# Patient Record
Sex: Male | Born: 1985 | Race: White | Hispanic: No | Marital: Married | Smoking: Never smoker
Health system: Southern US, Community
[De-identification: ages and names within clinical notes are randomized; demographics above are authoritative.]

---

## 2008-08-29 ENCOUNTER — Emergency Department: Payer: Self-pay | Admitting: Emergency Medicine
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
## 2010-09-28 ENCOUNTER — Emergency Department: Payer: Self-pay | Admitting: Emergency Medicine

## 2020-07-12 ENCOUNTER — Ambulatory Visit
Admission: EM | Admit: 2020-07-12 | Discharge: 2020-07-12 | Disposition: A | Discharge status: Home / Self Care | Payer: Self-pay | Attending: Family Medicine | Admitting: Family Medicine

## 2020-07-12 ENCOUNTER — Encounter: Payer: Self-pay | Admitting: Emergency Medicine

## 2020-07-12 ENCOUNTER — Other Ambulatory Visit: Payer: Self-pay

## 2020-07-12 DIAGNOSIS — R0981 Nasal congestion: Secondary | ICD-10-CM

## 2020-07-12 DIAGNOSIS — R059 Cough, unspecified: Secondary | ICD-10-CM

## 2020-07-12 DIAGNOSIS — J0110 Acute frontal sinusitis, unspecified: Secondary | ICD-10-CM

## 2020-07-12 DIAGNOSIS — J3489 Other specified disorders of nose and nasal sinuses: Secondary | ICD-10-CM

## 2020-07-12 DIAGNOSIS — R519 Headache, unspecified: Secondary | ICD-10-CM

## 2020-07-12 DIAGNOSIS — H66002 Acute suppurative otitis media without spontaneous rupture of ear drum, left ear: Secondary | ICD-10-CM

## 2020-07-12 MED ORDER — AMOXICILLIN-POT CLAVULANATE 875-125 MG PO TABS: 1.0000 | ORAL_TABLET | Freq: Two times a day (BID) | ORAL | 0 refills | Status: AC

## 2020-07-12 NOTE — ED Provider Notes (Signed)
[facility]   [id number] 07/12/20 Arrival Time: 1008   CC: COVID symptoms  SUBJECTIVE: History from: patient.  Jose Cobb is a 34 y.o. male who presents with abrupt onset of nasal congestion, PND, fatigue, headache, and sinus pain for the last week. Denies sick exposure to COVID, flu or strep. Denies recent travel. Has negative history of Covid. Has not completed Covid vaccines. Has taken Mucinex with temporary relief. Had negative Covid test at home. Declines Covid testing today. There are no aggravating or alleviating factors. Denies previous symptoms in the past. Denies fever, chills, fatigue, rhinorrhea, sore throat, SOB, wheezing, chest pain, nausea, changes in bowel or bladder habits.    ROS: As per HPI.  All other pertinent ROS negative.     History reviewed. No pertinent past medical history. History reviewed. No pertinent surgical history. No Known Allergies No current facility-administered medications on file prior to encounter.   No current outpatient medications on file prior to encounter.   Social History   Socioeconomic History  . Marital status: Married    Spouse name: Not on file  . Number of children: Not on file  . Years of education: Not on file  . Highest education level: Not on file  Occupational History  . Not on file  Tobacco Use  . Smoking status: Never Smoker  . Smokeless tobacco: Never Used  Substance and Sexual Activity  . Alcohol use: Not Currently  . Drug use: Never  . Sexual activity: Not on file  Other Topics Concern  . Not on file  Social History Narrative  . Not on file   Social Determinants of Health   Financial Resource Strain:   . Difficulty of Paying Living Expenses: Not on file  Food Insecurity:   . Worried About Programme researcher, broadcasting/film/video in the Last Year: Not on file  . Ran Out of Food in the Last Year: Not on file  Transportation Needs:   . Lack of Transportation (Medical): Not on file  . Lack of Transportation  (Non-Medical): Not on file  Physical Activity:   . Days of Exercise per Week: Not on file  . Minutes of Exercise per Session: Not on file  Stress:   . Feeling of Stress : Not on file  Social Connections:   . Frequency of Communication with Friends and Family: Not on file  . Frequency of Social Gatherings with Friends and Family: Not on file  . Attends Religious Services: Not on file  . Active Member of Clubs or Organizations: Not on file  . Attends Banker Meetings: Not on file  . Marital Status: Not on file  Intimate Partner Violence:   . Fear of Current or Ex-Partner: Not on file  . Emotionally Abused: Not on file  . Physically Abused: Not on file  . Sexually Abused: Not on file   History reviewed. No pertinent family history.  OBJECTIVE:  Vitals:   07/12/20 1024  BP: 106/72  Pulse: 96  Resp: 15  Temp: 98.6 F (37 C)  TempSrc: Oral  SpO2: 97%     General appearance: alert; appears fatigued, but nontoxic; speaking in full sentences and tolerating own secretions HEENT: NCAT; Ears: EACs clear, TMs pearly gray; Eyes: PERRL.  EOM grossly intact. Sinuses: nontender; Nose: nares patent without rhinorrhea, Throat: oropharynx clear, tonsils non erythematous or enlarged, uvula midline  Neck: supple without LAD Lungs: unlabored respirations, symmetrical air entry; cough: absent; no respiratory distress; CTAB Heart: regular rate and rhythm.  Radial pulses 2+ symmetrical bilaterally Skin: warm and dry Psychological: alert and cooperative; normal mood and affect  LABS:  No results found for this or any previous visit (from the past 24 hour(s)).   ASSESSMENT & PLAN:  1. Non-recurrent acute suppurative otitis media of left ear without spontaneous rupture of tympanic membrane   2. Acute non-recurrent frontal sinusitis   3. Cough   4. Nasal congestion   5. Sinus pain   6. Nonintractable headache, unspecified chronicity pattern, unspecified headache type     Meds  ordered this encounter  Medications  . amoxicillin-clavulanate (AUGMENTIN) 875-125 MG tablet    Sig: Take 1 tablet by mouth 2 (two) times daily for 7 days.    Dispense:  14 tablet    Refill:  0    Order Specific Question:   Supervising Provider    Answer:   Merrilee Jansky [id number]    Prescribed Augmentin to cover OM Take as directed and to completion  Get plenty of rest and push fluids Use OTC zyrtec for nasal congestion, runny nose, and/or sore throat Use OTC flonase for nasal congestion and runny nose Use medications daily for symptom relief Use OTC medications like ibuprofen or tylenol as needed fever or pain Call or go to the ED if you have any new or worsening symptoms such as fever, worsening cough, shortness of breath, chest tightness, chest pain, turning blue, changes in mental status.  Reviewed expectations re: course of current medical issues. Questions answered. Outlined signs and symptoms indicating need for more acute intervention. Patient verbalized understanding. After Visit Summary given.         Moshe Cipro, NP 07/12/20 1059

## 2020-07-12 NOTE — Discharge Instructions (Addendum)
I have sent in Augmentin for you to take twice a day for 7 days.  Follow up with this office or with primary care if symptoms are persisting.  Follow up in the ER for high fever, trouble swallowing, trouble breathing, other concerning symptoms.  

## 2020-07-12 NOTE — ED Triage Notes (Signed)
Patient c/o nasal congestion, runny nose, "sinus pressure x 1 week.   Patient stated onset of symptoms began with a productive cough w/ "yellowish-green" color. Patient stated cough has resolved.   Patient denies fever and SOB at home.   Patient endorses headache at times.   Patient has taken Mucinex w/ some relief of symptoms in regards to the cough.

## 2020-08-01 ENCOUNTER — Ambulatory Visit
Admission: EM | Admit: 2020-08-01 | Discharge: 2020-08-01 | Disposition: A | Discharge status: Home / Self Care | Payer: BC Managed Care – PPO | Attending: Family Medicine | Admitting: Family Medicine

## 2020-08-01 ENCOUNTER — Encounter: Payer: Self-pay | Admitting: Family Medicine

## 2020-08-01 DIAGNOSIS — B349 Viral infection, unspecified: Secondary | ICD-10-CM

## 2020-08-01 NOTE — Discharge Instructions (Addendum)
No concerns on exam today.  I believe this is something viral. Covid test pending.  You  can use over-the-counter medicines as needed.  Recommend allergy medication daily.

## 2020-08-01 NOTE — ED Triage Notes (Signed)
Pt presents to Urgent Care with c/o nasal congestion w/ yellowish mucus and cough. Reports visiting UC approx 3 weeks ago and was given amoxicillin for sinus infection. Reports symptoms subsided somewhat but became more severe two days ago. Pt w/ no known COVID exposure; pt has not been vaccinated.

## 2020-08-01 NOTE — ED Provider Notes (Signed)
[facility]al[facility]    CSN: [id number] Arrival date & time: 08/01/20  0850      History   Chief Complaint Chief Complaint  Patient presents with  . Nasal Congestion  . Cough    HPI TOMOTHY EDDINS is a 34 y.o. male.   Patient is a 34 year old male who presents today with nasal congestion, postnasal drip and cough.  Was treated for sinus infection approximate 3 weeks ago which symptoms resolved.  The symptoms started approximate 2 days ago.  No chest pain, shortness of breath or fevers.  Wife has also had viral type symptoms.  Son is also been recently sick.  Taking over-the-counter medicines without much relief.     History reviewed. No pertinent past medical history.  There are no problems to display for this patient.   History reviewed. No pertinent surgical history.     Home Medications    Prior to Admission medications   Medication Sig Start Date End Date Taking? Authorizing Provider  dextromethorphan-guaiFENesin (MUCINEX DM) 30-600 MG 12hr tablet Take 1 tablet by mouth 2 (two) times daily.    [provider]    Family History Family History  Problem Relation Age of Onset  . Healthy Mother   . Cancer Father     Social History Social History   Tobacco Use  . Smoking status: Never Smoker  . Smokeless tobacco: Never Used  Substance Use Topics  . Alcohol use: Yes    Comment: occasionally  . Drug use: Never     Allergies   Patient has no known allergies.   Review of Systems Review of Systems   Physical Exam Triage Vital Signs ED Triage Vitals  Enc Vitals Group     BP 08/01/20 0919 129/78     Pulse Rate 08/01/20 0919 83     Resp 08/01/20 0919 14     Temp 08/01/20 0919 97.8 F (36.6 C)     Temp Source 08/01/20 0919 Oral     SpO2 08/01/20 0919 96 %     Weight 08/01/20 0941 185 lb (83.9 kg)     Height 08/01/20 0941 6\' 2"  (1.88 m)     Head Circumference --      Peak Flow --      Pain Score 08/01/20 0941 0     Pain Loc --       Pain Edu? --      Excl. in GC? --    No data found.  Updated Vital Signs BP 129/78 (BP Location: Left Arm)   Pulse 83   Temp 97.8 F (36.6 C) (Oral)   Resp 14   Ht 6\' 2"  (1.88 m)   Wt 185 lb (83.9 kg)   SpO2 96%   BMI 23.75 kg/m   Visual Acuity Right Eye Distance:   Left Eye Distance:   Bilateral Distance:    Right Eye Near:   Left Eye Near:    Bilateral Near:     Physical Exam Vitals and nursing note reviewed.  Constitutional:      General: He is not in acute distress.    Appearance: Normal appearance. He is not ill-appearing, toxic-appearing or diaphoretic.  HENT:     Head: Normocephalic and atraumatic.     Right Ear: Tympanic membrane and ear canal normal.     Left Ear: Tympanic membrane and ear canal normal.     Nose: Congestion present.  Eyes:     Conjunctiva/sclera: Conjunctivae normal.  Cardiovascular:  Rate and Rhythm: Normal rate and regular rhythm.  Pulmonary:     Effort: Pulmonary effort is normal.     Breath sounds: Normal breath sounds.  Musculoskeletal:        General: Normal range of motion.     Cervical back: Normal range of motion.  Skin:    General: Skin is warm and dry.  Neurological:     Mental Status: He is alert.  Psychiatric:        Mood and Affect: Mood normal.      UC Treatments / Results  Labs (all labs ordered are listed, but only abnormal results are displayed) Labs Reviewed  RESP PANEL BY RT-PCR (FLU A&B, COVID) [id number]  NOVEL CORONAVIRUS, NAA    EKG   Radiology No results found.  Procedures Procedures (including critical care time)  Medications Ordered in UC Medications - No data to display  Initial Impression / Assessment and Plan / UC Course  I have reviewed the triage vital signs and the nursing notes.  Pertinent labs & imaging results that were available during my care of the patient were reviewed by me and considered in my medical decision making (see chart for details).     Viral  illness Symptoms been present x2 days.  Most likely some the viral.  No concerns on exam.  Covid test pending.  Recommend over-the-counter medicines as needed. Follow up as needed for continued or worsening symptoms  Final Clinical Impressions(s) / UC Diagnoses   Final diagnoses:  Viral illness     Discharge Instructions     No concerns on exam today.  I believe this is something viral. Covid test pending.  You  can use over-the-counter medicines as needed.  Recommend allergy medication daily.    ED Prescriptions    None     PDMP not reviewed this encounter.   Janace Aris, NP 08/01/20 1028

## 2020-08-02 LAB — SARS-COV-2, NAA 2 DAY TAT

## 2020-08-02 LAB — NOVEL CORONAVIRUS, NAA: SARS-CoV-2, NAA: NOT DETECTED

## 2020-08-08 ENCOUNTER — Telehealth: Payer: Self-pay | Admitting: Emergency Medicine

## 2020-08-08 MED ORDER — BENZONATATE 100 MG PO CAPS: 100.0000 mg | ORAL_CAPSULE | Freq: Three times a day (TID) | ORAL | 0 refills | Status: AC | PRN

## 2020-08-08 NOTE — Telephone Encounter (Signed)
Tessalon sent to pharmacy to try to aid in cough management.
